# Patient Record
Sex: Female | Born: 1990 | Race: Black or African American | Hispanic: No | Marital: Single | Smoking: Former smoker
Health system: Southern US, Community
[De-identification: ages and names within clinical notes are randomized; demographics above are authoritative.]

## PROBLEM LIST (undated history)

## (undated) HISTORY — PX: INCISION AND DRAINAGE BREAST ABSCESS: SUR672

---

## 2013-07-12 ENCOUNTER — Emergency Department (HOSPITAL_BASED_OUTPATIENT_CLINIC_OR_DEPARTMENT_OTHER)
Admission: EM | Admit: 2013-07-12 | Discharge: 2013-07-12 | Disposition: A | Discharge status: Home / Self Care | Payer: Self-pay | Attending: Emergency Medicine | Admitting: Emergency Medicine

## 2013-07-12 ENCOUNTER — Encounter (HOSPITAL_BASED_OUTPATIENT_CLINIC_OR_DEPARTMENT_OTHER): Payer: Self-pay | Admitting: Emergency Medicine

## 2013-07-12 DIAGNOSIS — F17200 Nicotine dependence, unspecified, uncomplicated: Secondary | ICD-10-CM | POA: Insufficient documentation

## 2013-07-12 DIAGNOSIS — R220 Localized swelling, mass and lump, head: Secondary | ICD-10-CM | POA: Insufficient documentation

## 2013-07-12 MED ORDER — CLINDAMYCIN HCL 150 MG PO CAPS: 150.0000 mg | ORAL_CAPSULE | Freq: Four times a day (QID) | ORAL | Status: AC

## 2013-07-12 NOTE — ED Notes (Addendum)
Knot under her chin and right neck for a month. States she has pain in those areas if she coughs or moves her head in a certain way. Family hx of lymphoma CA

## 2013-07-16 NOTE — ED Provider Notes (Signed)
CSN: [id number]     Arrival date & time 07/12/13  1035 History   First MD Initiated Contact with Patient 07/12/13 1149     Chief Complaint  Patient presents with  . Lymphadenopathy   (Consider location/radiation/quality/duration/timing/severity/associated sxs/prior Treatment) HPI  22 y.o. Female with swelling of submandibular area for several weeks.  Area is not enlarging and is nontender.  She noted this area after having a tongue piercing a month ago.  She denies fever, difficulty speaking or swallowing, sore throat, chest pain, or dyspnea.    History reviewed. No pertinent past medical history. Past Surgical History  Procedure Laterality Date  . Incision and drainage breast abscess     No family history on file. History  Substance Use Topics  . Smoking status: Current Every Day Smoker    Types: Cigarettes  . Smokeless tobacco: Not on file  . Alcohol Use: No   OB History   Grav Para Term Preterm Abortions TAB SAB Ect Mult Living                 Review of Systems  All other systems reviewed and are negative.    Allergies  Review of patient's allergies indicates no known allergies.  Home Medications   Current Outpatient Rx  Name  Route  Sig  Dispense  Refill  . clindamycin (CLEOCIN) 150 MG capsule   Oral   Take 1 capsule (150 mg total) by mouth every 6 (six) hours.   28 capsule   0    BP 126/64  Pulse 72  Temp(Src) 99.1 F (37.3 C) (Oral)  Resp 20  Wt 220 lb (99.791 kg)  SpO2 100% Physical Exam  Nursing note and vitals reviewed. Constitutional: She is oriented to person, place, and time. She appears well-developed and well-nourished.  HENT:  Head: Normocephalic and atraumatic.  Right Ear: External ear normal.  Left Ear: External ear normal.  Nose: Nose normal.  Mouth/Throat: Oropharynx is clear and moist.  1 x 1 cm right submandibular nodule palpated with distinct borderes, nontender, no redness or warmth.   Eyes: Conjunctivae and EOM are normal.  Pupils are equal, round, and reactive to light.  Neck: Normal range of motion. Neck supple.  Cardiovascular: Normal rate, regular rhythm, normal heart sounds and intact distal pulses.   Pulmonary/Chest: Effort normal and breath sounds normal.  Abdominal: Soft. Bowel sounds are normal.  Musculoskeletal: Normal range of motion.  Neurological: She is alert and oriented to person, place, and time. She has normal reflexes.  Skin: Skin is warm and dry.  Psychiatric: She has a normal mood and affect. Her behavior is normal. Thought content normal.    ED Course  Procedures (including critical care time) Labs Review Labs Reviewed - No data to display Imaging Review No results found.  EKG Interpretation   None       MDM   1. Mass of submandibular region    Patient placed on clindamycin and advised recheck with ent.  This is likely a lymph node given her recent tongue piercing.  She is advised to return if any swelling or fever and that recheck is important to assure it has resolved.  She is given referral to [facility].     Hilario Quarry, MD 07/16/13 [phone]

## 2016-07-07 ENCOUNTER — Emergency Department (HOSPITAL_BASED_OUTPATIENT_CLINIC_OR_DEPARTMENT_OTHER): Payer: Self-pay

## 2016-07-07 ENCOUNTER — Emergency Department (HOSPITAL_BASED_OUTPATIENT_CLINIC_OR_DEPARTMENT_OTHER)
Admission: EM | Admit: 2016-07-07 | Discharge: 2016-07-07 | Disposition: A | Discharge status: Home / Self Care | Payer: Self-pay | Attending: Emergency Medicine | Admitting: Emergency Medicine

## 2016-07-07 ENCOUNTER — Encounter (HOSPITAL_BASED_OUTPATIENT_CLINIC_OR_DEPARTMENT_OTHER): Payer: Self-pay | Admitting: *Deleted

## 2016-07-07 DIAGNOSIS — Z87891 Personal history of nicotine dependence: Secondary | ICD-10-CM | POA: Insufficient documentation

## 2016-07-07 DIAGNOSIS — M25561 Pain in right knee: Secondary | ICD-10-CM | POA: Insufficient documentation

## 2016-07-07 MED ORDER — NAPROXEN 500 MG PO TABS: 500.0000 mg | ORAL_TABLET | Freq: Two times a day (BID) | ORAL | 0 refills | Status: AC

## 2016-07-07 MED ORDER — NAPROXEN 250 MG PO TABS
500.0000 mg | ORAL_TABLET | Freq: Once | ORAL | Status: AC
  Administered 2016-07-07: 500 mg via ORAL
  Filled 2016-07-07: qty 2

## 2016-07-07 NOTE — ED Triage Notes (Addendum)
States an hour ago she was standing and her right knee dislocated but popped back in place. Pain continues since. She is able to ambulate.

## 2016-07-07 NOTE — ED Provider Notes (Signed)
[facility] Provider Note   CSN: [id number] Arrival date & time: 07/07/16  1353     History   Chief Complaint Chief Complaint  Patient presents with  . Knee Pain    HPI Ashley Newton is a 25 y.o. female.  The history is provided by the patient.  Knee Pain   This is a new problem. The current episode started 1 to 2 hours ago. The problem occurs constantly. The problem has not changed since onset.The pain is present in the right knee. The quality of the pain is described as aching. The pain is moderate. Pertinent negatives include full range of motion and no stiffness. The symptoms are aggravated by standing. She has tried nothing for the symptoms. There has been no history of extremity trauma.    History reviewed. No pertinent past medical history.  There are no active problems to display for this patient.   Past Surgical History:  Procedure Laterality Date  . INCISION AND DRAINAGE BREAST ABSCESS      OB History    No data available       Home Medications    Prior to Admission medications   Medication Sig Start Date End Date Taking? Authorizing Provider  clindamycin (CLEOCIN) 150 MG capsule Take 1 capsule (150 mg total) by mouth every 6 (six) hours. 07/12/13   Margarita Grizzle, MD    Family History No family history on file.  Social History Social History  Substance Use Topics  . Smoking status: Former Smoker    Types: Cigarettes  . Smokeless tobacco: Never Used  . Alcohol use No     Allergies   Review of patient's allergies indicates no known allergies.   Review of Systems Review of Systems  Musculoskeletal: Negative for stiffness.  All other systems reviewed and are negative.    Physical Exam Updated Vital Signs BP 119/67   Pulse 70   Temp 98.4 F (36.9 C) (Oral)   Resp 20   Ht 5\' 3"  (1.6 m)   Wt 220 lb (99.8 kg)   LMP 07/03/2016   SpO2 100%   BMI 38.97 kg/m   Physical Exam  Constitutional: She is oriented to person,  place, and time. She appears well-developed and well-nourished. No distress.  HENT:  Head: Normocephalic.  Nose: Nose normal.  Eyes: Conjunctivae are normal.  Neck: Neck supple. No tracheal deviation present.  Cardiovascular: Normal rate and regular rhythm.   Pulmonary/Chest: Effort normal. No respiratory distress.  Abdominal: Soft. She exhibits no distension.  Musculoskeletal:       Right knee: She exhibits normal range of motion, no swelling, no effusion, no ecchymosis, no deformity, no laceration, no LCL laxity, normal patellar mobility, no bony tenderness, normal meniscus and no MCL laxity. Tenderness (over muscles proximal to knee) found.  ACL/PCL in tact to confrontation  Neurological: She is alert and oriented to person, place, and time.  Skin: Skin is warm and dry.  Psychiatric: She has a normal mood and affect.     ED Treatments / Results  Labs (all labs ordered are listed, but only abnormal results are displayed) Labs Reviewed - No data to display  EKG  EKG Interpretation None       Radiology Dg Knee Complete 4 Views Right  Result Date: 07/07/2016 CLINICAL DATA:  Dislocation. EXAM: RIGHT KNEE - COMPLETE 4+ VIEW COMPARISON:  No recent prior. FINDINGS: No acute bony or joint abnormality identified. No evidence of fracture or dislocation. IMPRESSION: No acute or focal abnormality.  Electronically Signed   By: Maisie Fushomas  Register   On: 07/07/2016 14:28    Procedures Procedures (including critical care time)  Medications Ordered in ED Medications  naproxen (NAPROSYN) tablet 500 mg (not administered)     Initial Impression / Assessment and Plan / ED Course  I have reviewed the triage vital signs and the nursing notes.  Pertinent labs & imaging results that were available during my care of the patient were reviewed by me and considered in my medical decision making (see chart for details).  Clinical Course    25 y.o. female presents with right knee pain after her  knee buckled under her and she fell. She has been ambulatory with some pain around the knee since. Ligaments are in tact to confrontation. No abnormality on plain film. Suspect soft tissue injury. Patient was recommended to take short course of scheduled NSAIDs and engage in early mobility as definitive treatment.   Final Clinical Impressions(s) / ED Diagnoses   Final diagnoses:  Acute pain of right knee    New Prescriptions Discharge Medication List as of 07/07/2016  3:02 PM    START taking these medications   Details  naproxen (NAPROSYN) 500 MG tablet Take 1 tablet (500 mg total) by mouth 2 (two) times daily with a meal., Starting Thu 07/07/2016, Until Tue 07/12/2016, Print         Lyndal Pulleyaniel Jake Fuhrmann, MD 07/07/16 1735

## 2017-06-05 IMAGING — CR DG KNEE COMPLETE 4+V*R*
4 series · 4 of 4 positions shown · non-contrast
Comparison: No recent prior.

CLINICAL DATA: Dislocation.

EXAM:
RIGHT KNEE - COMPLETE 4+ VIEW

[t knee ap right]
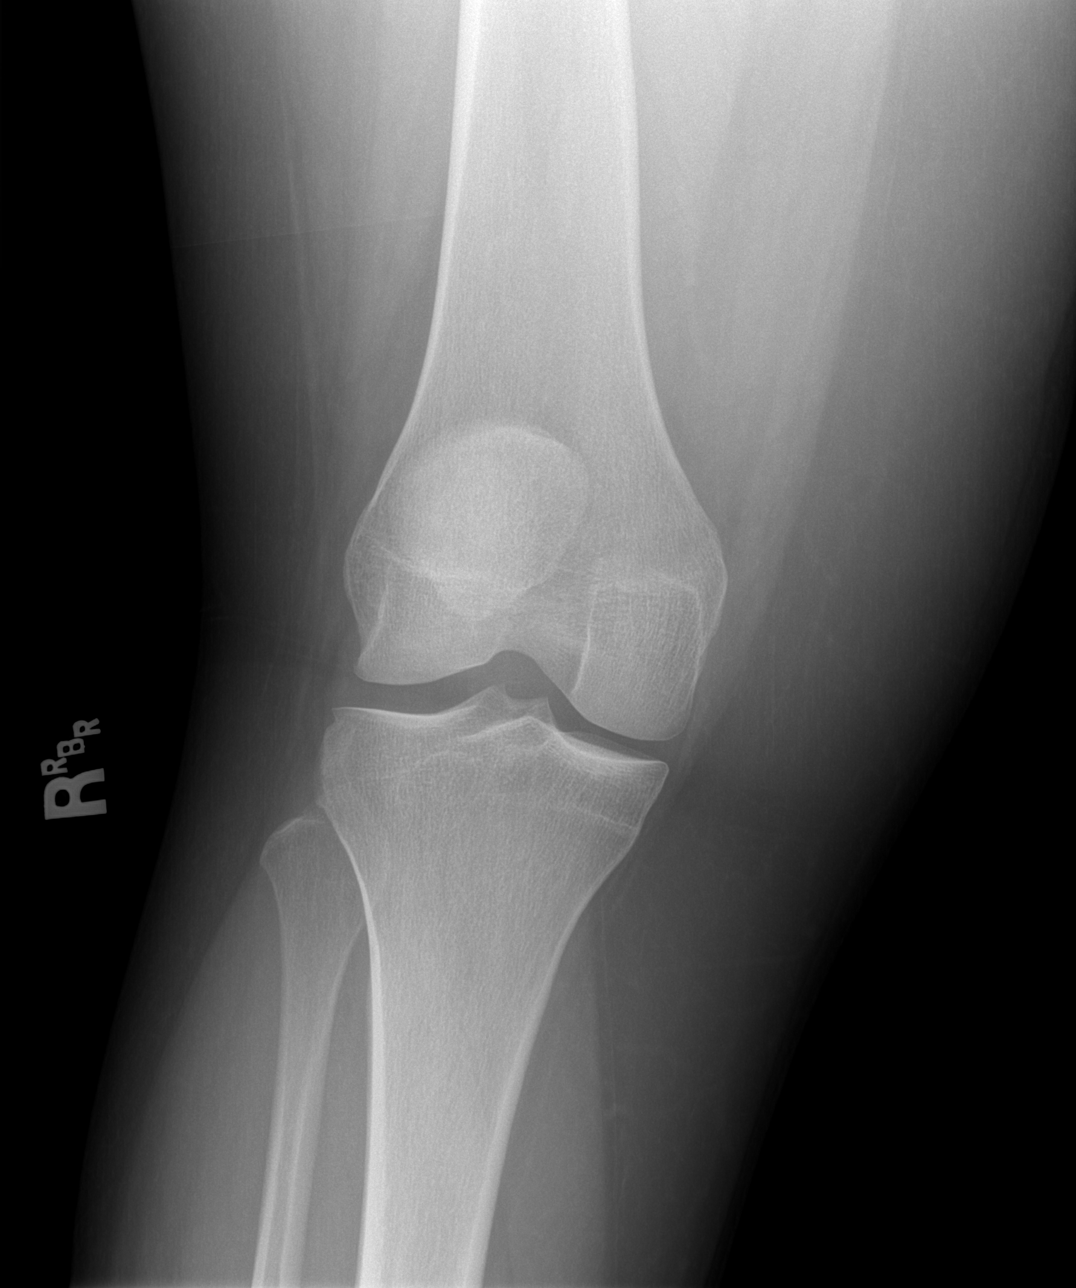

[t knee oblique right (1 of 2)]
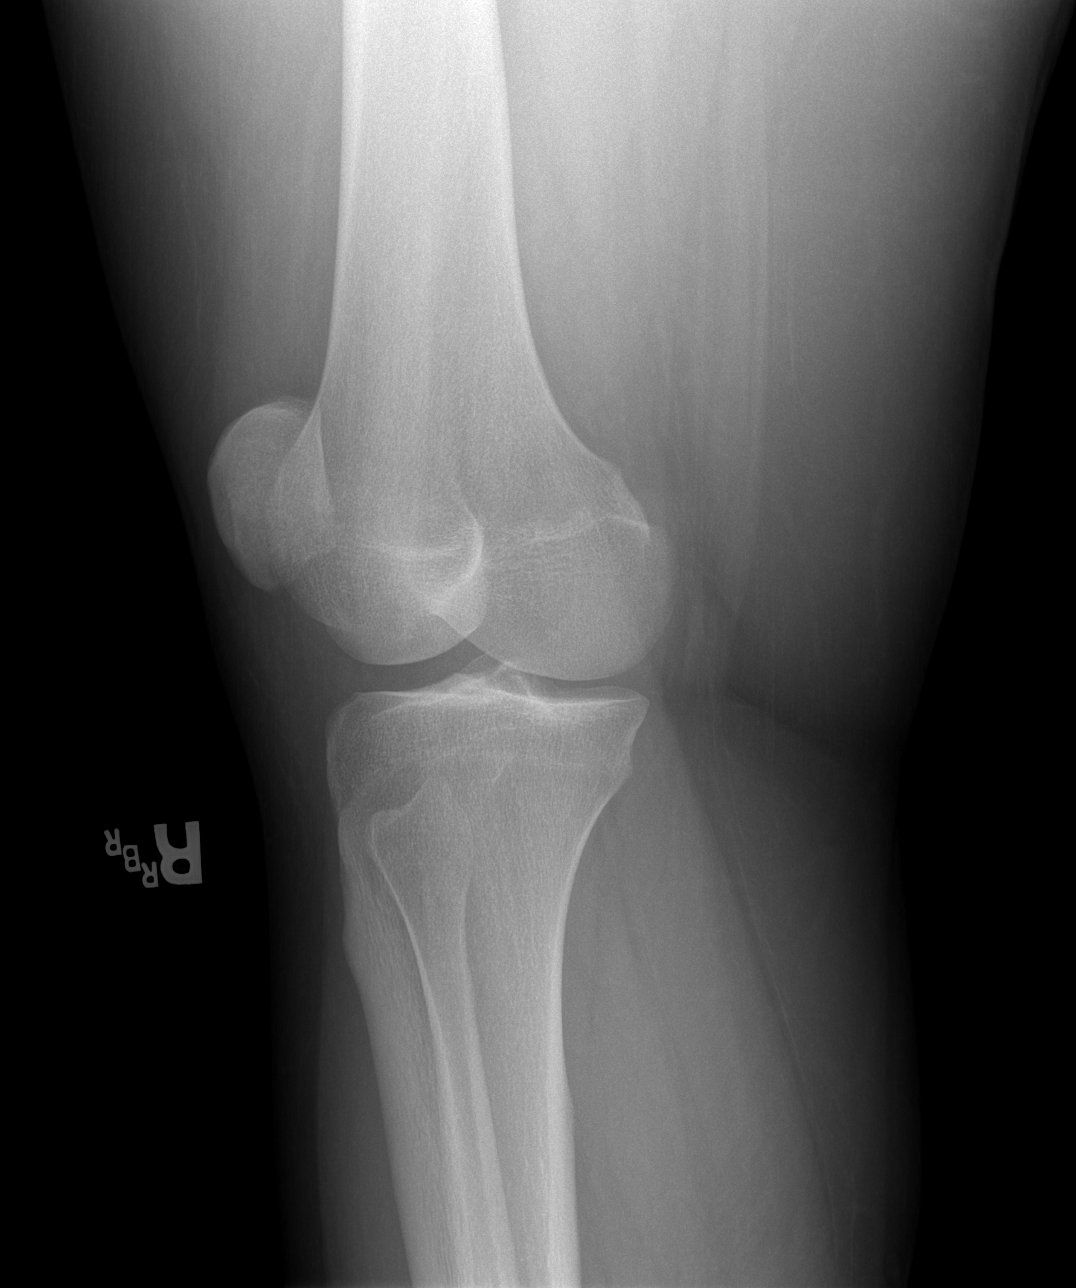

[t knee oblique right (2 of 2)]
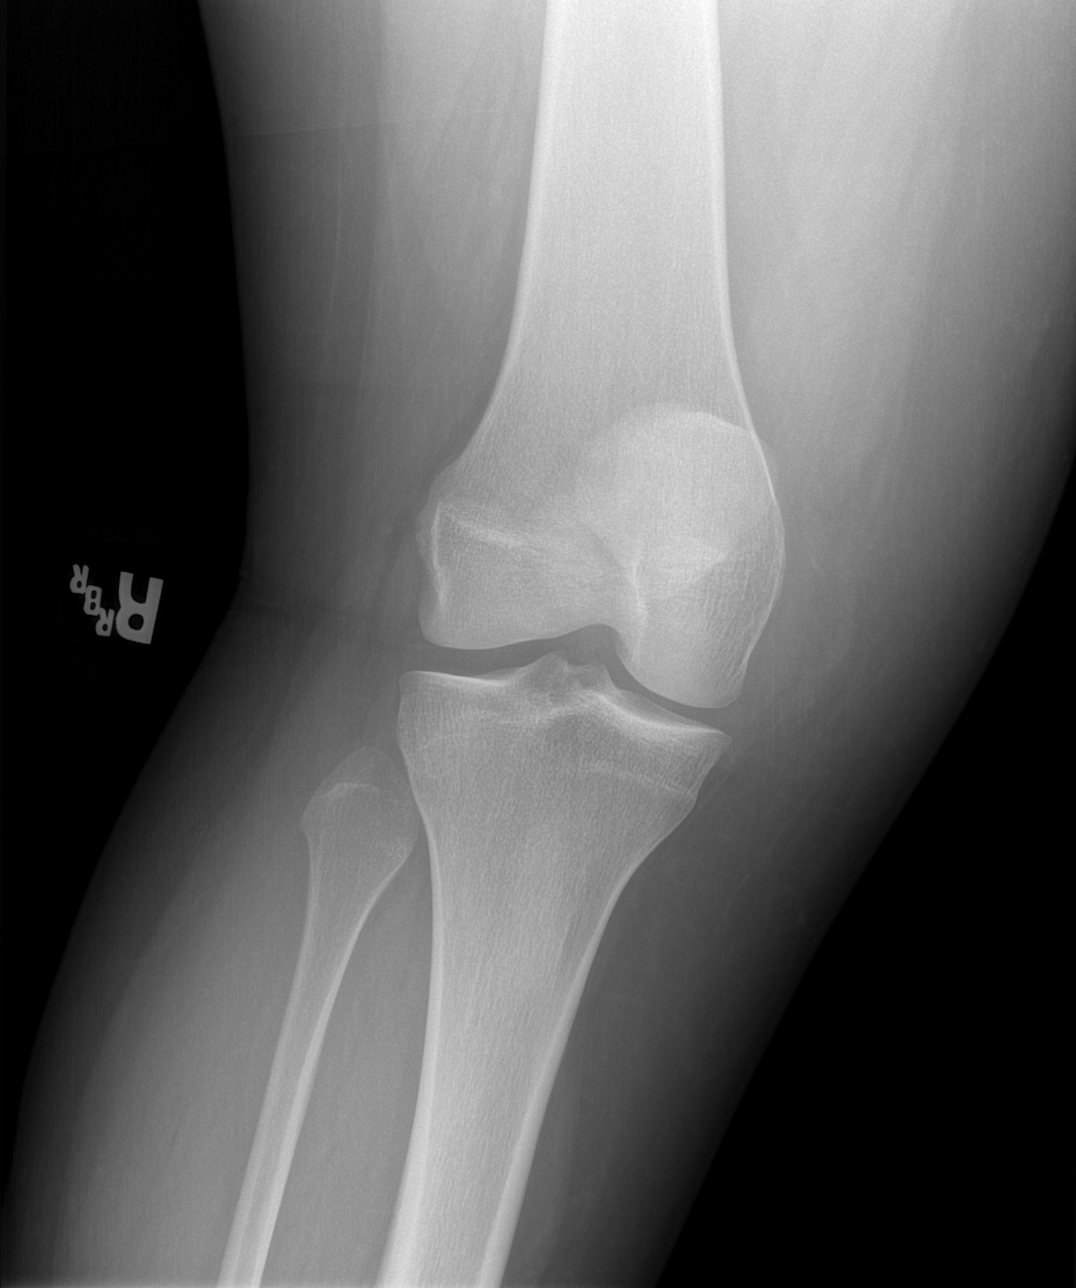

[t knee lat right]
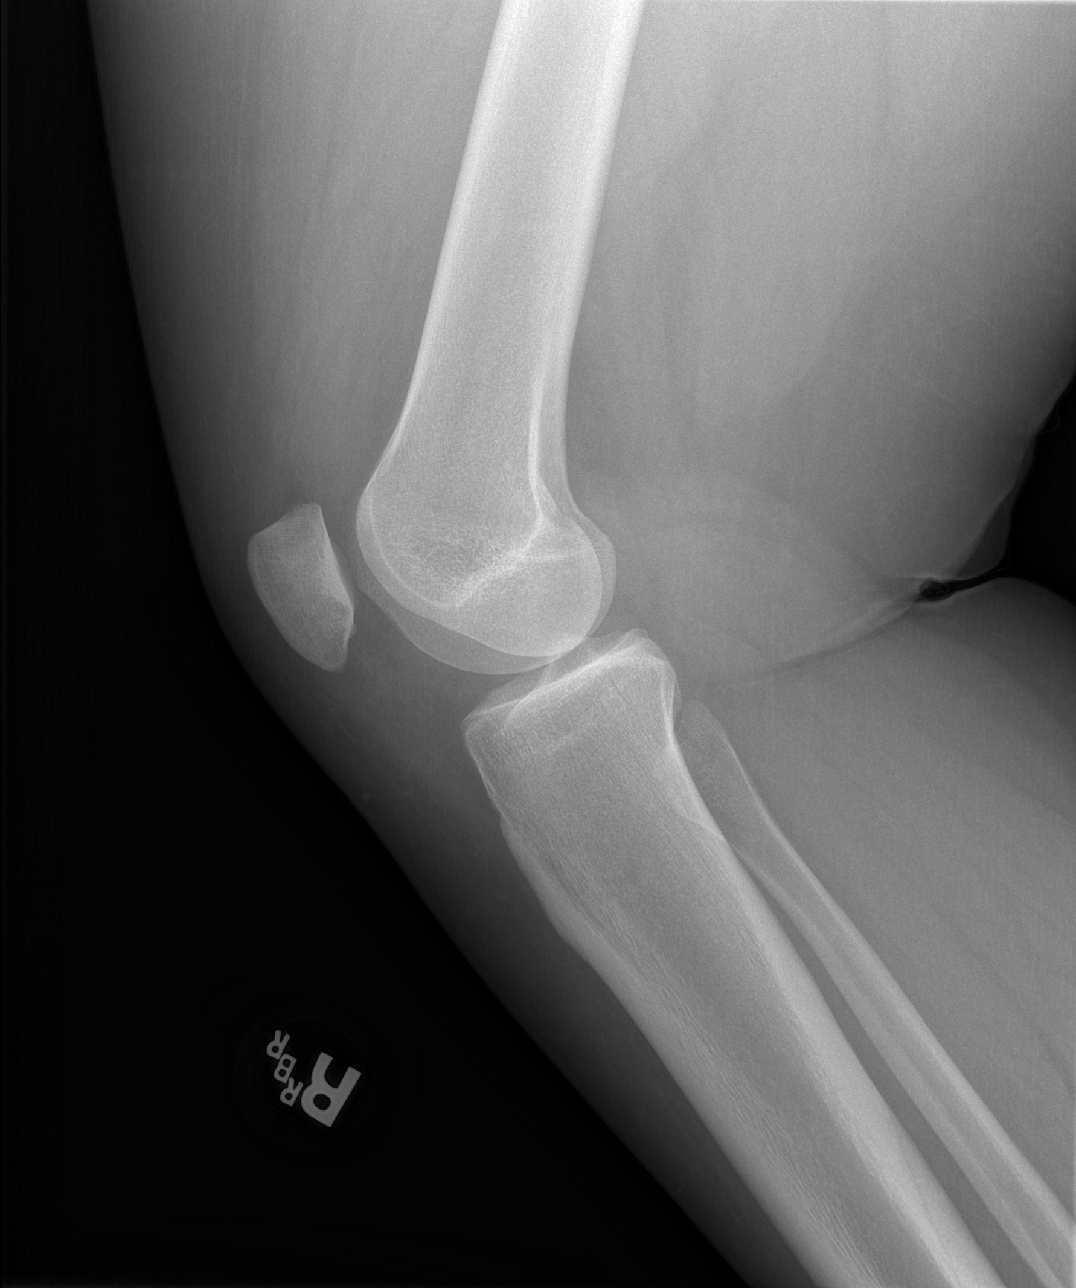

[4 of 4 positions shown; findings below may reference images not displayed]

FINDINGS: No acute bony or joint abnormality identified. No evidence of
fracture or dislocation.
IMPRESSION: No acute or focal abnormality.

## 2019-04-11 ENCOUNTER — Other Ambulatory Visit: Payer: Self-pay

## 2019-04-11 ENCOUNTER — Encounter (HOSPITAL_COMMUNITY): Payer: Self-pay

## 2019-04-11 ENCOUNTER — Emergency Department (HOSPITAL_COMMUNITY)
Admission: EM | Admit: 2019-04-11 | Discharge: 2019-04-11 | Disposition: A | Discharge status: Home / Self Care | Payer: BLUE CROSS/BLUE SHIELD | Attending: Emergency Medicine | Admitting: Emergency Medicine

## 2019-04-11 DIAGNOSIS — Z5321 Procedure and treatment not carried out due to patient leaving prior to being seen by health care provider: Secondary | ICD-10-CM | POA: Insufficient documentation

## 2019-04-11 DIAGNOSIS — H9201 Otalgia, right ear: Secondary | ICD-10-CM | POA: Insufficient documentation

## 2019-04-11 NOTE — ED Triage Notes (Signed)
Pt arrived stating at 1800 tonight she felt like she was getting an ear infection, she reports having them often. Pain in right ear, states she took three ibuprofen when the pain started with no relief. Denies anything making it worse or better.
